# Patient Record
Sex: Female | Born: 1950 | Race: White | Hispanic: No | Smoking: Never smoker
Health system: Southern US, Community
[De-identification: ages and names within clinical notes are randomized; demographics above are authoritative.]

## PROBLEM LIST (undated history)

## (undated) DIAGNOSIS — Z8669 Personal history of other diseases of the nervous system and sense organs: Secondary | ICD-10-CM

## (undated) DIAGNOSIS — G35 Multiple sclerosis: Secondary | ICD-10-CM

## (undated) DIAGNOSIS — H539 Unspecified visual disturbance: Secondary | ICD-10-CM

## (undated) HISTORY — PX: OTHER SURGICAL HISTORY: SHX169

## (undated) HISTORY — PX: BREAST LUMPECTOMY: SHX2

## (undated) HISTORY — DX: Unspecified visual disturbance: H53.9

## (undated) HISTORY — DX: Personal history of other diseases of the nervous system and sense organs: Z86.69

## (undated) HISTORY — DX: Multiple sclerosis: G35

## (undated) HISTORY — PX: ABDOMINAL HYSTERECTOMY: SHX81

---

## 2014-12-04 ENCOUNTER — Other Ambulatory Visit: Payer: Self-pay | Admitting: Certified Nurse Midwife

## 2014-12-04 DIAGNOSIS — R531 Weakness: Secondary | ICD-10-CM

## 2014-12-04 DIAGNOSIS — G549 Nerve root and plexus disorder, unspecified: Secondary | ICD-10-CM

## 2014-12-16 ENCOUNTER — Ambulatory Visit
Admission: RE | Admit: 2014-12-16 | Discharge: 2014-12-16 | Disposition: A | Discharge status: Home / Self Care | Payer: 59 | Source: Ambulatory Visit | Attending: Certified Nurse Midwife | Admitting: Certified Nurse Midwife

## 2014-12-16 DIAGNOSIS — G549 Nerve root and plexus disorder, unspecified: Secondary | ICD-10-CM

## 2014-12-16 DIAGNOSIS — R531 Weakness: Secondary | ICD-10-CM

## 2014-12-16 MED ORDER — GADOBENATE DIMEGLUMINE 529 MG/ML IV SOLN
15.0000 mL | Freq: Once | INTRAVENOUS | Status: AC | PRN
  Administered 2014-12-16: 15 mL via INTRAVENOUS

## 2014-12-31 ENCOUNTER — Telehealth: Payer: Self-pay | Admitting: *Deleted

## 2014-12-31 ENCOUNTER — Encounter: Payer: Self-pay | Admitting: Neurology

## 2014-12-31 ENCOUNTER — Ambulatory Visit (INDEPENDENT_AMBULATORY_CARE_PROVIDER_SITE_OTHER): Payer: 59 | Admitting: Neurology

## 2014-12-31 VITALS — BP 139/71 | HR 80 | Ht 62.0 in | Wt 176.2 lb

## 2014-12-31 DIAGNOSIS — G379 Demyelinating disease of central nervous system, unspecified: Secondary | ICD-10-CM

## 2014-12-31 DIAGNOSIS — G35 Multiple sclerosis: Secondary | ICD-10-CM

## 2014-12-31 NOTE — Progress Notes (Addendum)
[facility]    Provider:  Dr Lucia Gaskins Referring Provider: Nira Conn, MD Primary Care Physician:  No primary care provider on file.  CC:  Left-sided symptoms  HPI:  Isabel Cross is a 64 y.o. female here as a referral from Dr. Lahoma Rocker for left-sided symptoms . Started with numbness on the bottom of her feet years ago. The symptoms progressed, like walking on wadded up socks with numbness. She started having back pain and a chiropractor helped. Symptoms have been slowly accumulating since then. She started dragging her left toe 3-4 months ago. Having left arm pain and weakness. She will drop dishes out of the left hand. Her left leg with give-way and she will fall.  She has neck pain that radiates into the shoulder. Her left arm is weak. No numbness or tingling in the left arm. Her grip is poor in the left arm. The right arm and leg are perfect. She has some numbness in the left leg and he can't feel it as much. No sensory changes in the face, no double vision or viision changes, no dysphagia or dysarthria, no change in bowel or bladder. No FHx of multiple sclerosis or autoimmune disease.   Reviewed notes, labs and imaging from outside physicians, which showed:  MRI of the cervical and thoracic spine w/wo contrast(personally reviewed and agree with following):   C-spine:  Abnormal left sided T2 cord signal extending from C3-C5. There may be more subtle abnormal cord signal on the right. This could be seen with multiple sclerosis, myelomalacia from old insult, and M-mode, vasculitis, neurosarcoidosis or ADEM among other causes. Post-gad imaging of the cervical spine is recommended for further assessment.   Lumbar spine: 1. Multiple areas of abnormal signal in the thoracic spinal cord consistent with demyelination similar to the appearance in the cervical spinal cord. The findings are consistent with demyelinating disease. 2. 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 3. Cholelithiasis.  Review of Systems: Patient complains of symptoms per HPI as well as the following symptoms: Fatigue, numbness, weakness, aching muscles, decreased energy. Pertinent negatives per HPI. All others negative.   Social History   Social History  . Marital Status: Widowed    Spouse Name: N/A  . Number of Children: 1  . Years of Education: 11   Occupational History  . Retired    Social History Main Topics  . Smoking status: Never Smoker   . Smokeless tobacco: Not on file  . Alcohol Use: No  . Drug Use: No  . Sexual Activity: Not on file   Other Topics Concern  . Not on file   Social History Narrative   Lives at home alone   Caffeine use: 2 cups coffee per day   Tea/soda occasionally     Family History  Problem Relation Age of Onset  . CAD Mother   . Renal cancer    . Colon cancer    . Breast cancer Sister   . Multiple sclerosis Neg Hx     Past Medical History  Diagnosis Date  . History of nerve impingement     Past Surgical History  Procedure Laterality Date  . Abdominal hysterectomy      For fibroids. Still has ovaries.  . Breast lumpectomy Right     x2, both benign  . Abscess Right     Brody's abscess removed- ankle    No current outpatient prescriptions on file.   No current facility-administered medications for this visit.  Allergies as of 12/31/2014 - Review Complete 12/31/2014  Allergen Reaction Noted  . Other  12/31/2014    Vitals: BP 139/71 mmHg  Pulse 80  Ht  (1.575 m)  Wt 176 lb 3.2 oz (79.924 kg)  BMI 32.22 kg/m2 Last Weight:  Wt Readings from Last 1 Encounters:  12/31/14 176 lb 3.2 oz (79.924 kg)   Last Height:   Ht Readings from Last 1 Encounters:  12/31/14  (1.575 m)   Physical exam: Exam: Gen: NAD, conversant, well nourised, obese, well groomed                     CV: RRR, no MRG. No Carotid Bruits. No peripheral edema, warm, nontender Eyes: Conjunctivae clear without  exudates or hemorrhage  Neuro: Detailed Neurologic Exam  Speech:    Speech is normal; fluent and spontaneous with normal comprehension.  Cognition:    The patient is oriented to person, place, and time;     recent and remote memory intact;     language fluent;     normal attention, concentration,     fund of knowledge Cranial Nerves:    The pupils are equal, round, and reactive to light. The fundi areflat. Visual fields are full to finger confrontation. Extraocular movements are intact. Trigeminal sensation is intact and the muscles of mastication are normal. The face is symmetric. The palate elevates in the midline. Hearing intact. Voice is normal. Shoulder shrug is normal. The tongue has normal motion without fasciculations.   Coordination:    No dysmetria  Gait:    Mild steppage gait, also legs of unequal length  Motor Observation:    No asymmetry, no atrophy, and no involuntary movements noted. Tone: Increased tone in the left > right leg   Posture:    Posture is normal.    Strength: Left deltoid 4+5 Left HF 3+/5 Left leg flexion 4+/5 Left DF 1/5 Left PF 3/5  Otherwise strength is V/V in the upper and lower limbs.      Sensation: intact to LT     Reflex Exam:  DTR's: absent left AJ otherwise brisk   Toes:    The toes are equivocal bilaterally.   Clonus:    3 beats on the right ankle   Assessment/Plan:  Very lovely 64 year old female with a significant burden of demyelinating plaques in the cervical and thoracic spine likely multiple sclerosis. An MRI of the brain w/wo contrast ordered and an MRI of the cervical spine w/wo contrast previous ordered by nsy is pending and labs. Pending MRI of the brain will discuss possible lumbar puncture however given imaging the likelihood of MS is high. Will refer to our MS internal clinic.   CC: Nira Conn, MD  Addendum: stratify serum JCV AB w/rfx negative 01/08/2015  Naomie Dean, MD  [facility] [address] [place], [place] [address]  Phone [phone] Fax [phone]

## 2014-12-31 NOTE — Patient Instructions (Signed)
Remember to drink plenty of fluid, eat healthy meals and do not skip any meals. Try to eat protein with a every meal and eat a healthy snack such as fruit or nuts in between meals. Try to keep a regular sleep-wake schedule and try to exercise daily, particularly in the form of walking, 20-30 minutes a day, if you can.   As far as diagnostic testing: MRI of the brain, labs  I would like to see you back after imaging complete, sooner if we need to. Please call us with any interim questions, concerns, problems, updates or refill requests.   Referral to Dr. Despina Arias, MS specialist  Our phone number is [phone]. We also have an after hours call service for urgent matters and there is a physician on-call for urgent questions. For any emergencies you know to call 911 or go to the nearest emergency room

## 2014-12-31 NOTE — Telephone Encounter (Signed)
Called Shiane at [facility] for lab pick-up lock box/routine. Confirmation number: [id number].

## 2015-01-01 ENCOUNTER — Encounter: Payer: Self-pay | Admitting: Neurology

## 2015-01-01 DIAGNOSIS — G35 Multiple sclerosis: Secondary | ICD-10-CM | POA: Insufficient documentation

## 2015-01-02 LAB — MULTIPLE MYELOMA PANEL, SERUM
ALBUMIN/GLOB SERPL: 1.2 (ref 0.7–1.7)
ALPHA2 GLOB SERPL ELPH-MCNC: 0.8 g/dL (ref 0.4–1.0)
Albumin SerPl Elph-Mcnc: 3.7 g/dL (ref 2.9–4.4)
Alpha 1: 0.2 g/dL (ref 0.0–0.4)
B-GLOBULIN SERPL ELPH-MCNC: 1.1 g/dL (ref 0.7–1.3)
GLOBULIN, TOTAL: 3.3 g/dL (ref 2.2–3.9)
Gamma Glob SerPl Elph-Mcnc: 1.1 g/dL (ref 0.4–1.8)
IgA/Immunoglobulin A, Serum: 290 mg/dL (ref 87–352)
IgG (Immunoglobin G), Serum: 1157 mg/dL (ref 700–1600)
IgM (Immunoglobulin M), Srm: 94 mg/dL (ref 26–217)

## 2015-01-02 LAB — CBC
HEMOGLOBIN: 12.7 g/dL (ref 11.1–15.9)
Hematocrit: 38.1 % (ref 34.0–46.6)
MCH: 28.6 pg (ref 26.6–33.0)
MCHC: 33.3 g/dL (ref 31.5–35.7)
MCV: 86 fL (ref 79–97)
Platelets: 268 10*3/uL (ref 150–379)
RBC: 4.44 x10E6/uL (ref 3.77–5.28)
RDW: 13.2 % (ref 12.3–15.4)
WBC: 7.4 10*3/uL (ref 3.4–10.8)

## 2015-01-02 LAB — NEUROMYELITIS OPTICA AUTOAB, IGG: NMO-IgG: <1.5 U/mL (ref 0.0–3.0)

## 2015-01-02 LAB — COMPREHENSIVE METABOLIC PANEL
ALK PHOS: 104 IU/L (ref 39–117)
ALT: 16 IU/L (ref 0–32)
AST: 15 IU/L (ref 0–40)
Albumin/Globulin Ratio: 1.5 (ref 1.1–2.5)
Albumin: 4.2 g/dL (ref 3.6–4.8)
BUN/Creatinine Ratio: 21 (ref 11–26)
BUN: 12 mg/dL (ref 8–27)
Bilirubin Total: <0.2 mg/dL (ref 0.0–1.2)
CALCIUM: 9.7 mg/dL (ref 8.7–10.3)
CO2: 21 mmol/L (ref 18–29)
CREATININE: 0.56 mg/dL — AB (ref 0.57–1.00)
Chloride: 104 mmol/L (ref 97–106)
GFR calc Af Amer: 115 mL/min/{1.73_m2} (ref >59)
GFR, EST NON AFRICAN AMERICAN: 100 mL/min/{1.73_m2} (ref >59)
GLUCOSE: 95 mg/dL (ref 65–99)
Globulin, Total: 2.8 g/dL (ref 1.5–4.5)
Potassium: 4 mmol/L (ref 3.5–5.2)
Sodium: 140 mmol/L (ref 136–144)
Total Protein: 7 g/dL (ref 6.0–8.5)

## 2015-01-02 LAB — ANA COMPREHENSIVE PANEL
Anti JO-1: <0.2 AI (ref 0.0–0.9)
Centromere Ab Screen: <0.2 AI (ref 0.0–0.9)
ENA RNP Ab: 0.2 AI (ref 0.0–0.9)
ENA SSB (LA) Ab: <0.2 AI (ref 0.0–0.9)
dsDNA Ab: 1 IU/mL (ref 0–9)

## 2015-01-02 LAB — RPR: RPR Ser Ql: NONREACTIVE

## 2015-01-02 LAB — B12 AND FOLATE PANEL
FOLATE: 6.1 ng/mL (ref >3.0)
VITAMIN B 12: 435 pg/mL (ref 211–946)

## 2015-01-02 LAB — HIV ANTIBODY (ROUTINE TESTING W REFLEX): HIV Screen 4th Generation wRfx: NONREACTIVE

## 2015-01-02 LAB — TSH: TSH: 1.58 u[IU]/mL (ref 0.450–4.500)

## 2015-01-02 LAB — RHEUMATOID FACTOR

## 2015-01-02 LAB — ANA: ANA Titer 1: NEGATIVE

## 2015-01-02 LAB — ANGIOTENSIN CONVERTING ENZYME: Angio Convert Enzyme: 41 U/L (ref 14–82)

## 2015-01-04 ENCOUNTER — Telehealth: Payer: Self-pay | Admitting: Neurology

## 2015-01-04 NOTE — Telephone Encounter (Signed)
Spoke to patient. Gave lab results and instructions per Dr. Trevor Mace lab result note. Patient verbalized understanding. She states her MRI of the Brain is scheduled for 01/17/15.

## 2015-01-04 NOTE — Telephone Encounter (Signed)
Patient returned call, regarding lab results, received message yesterday 01/03/15.

## 2015-01-13 ENCOUNTER — Other Ambulatory Visit: Payer: 59

## 2015-01-17 ENCOUNTER — Ambulatory Visit
Admission: RE | Admit: 2015-01-17 | Discharge: 2015-01-17 | Disposition: A | Discharge status: Home / Self Care | Payer: 59 | Source: Ambulatory Visit | Attending: Neurology | Admitting: Neurology

## 2015-01-17 DIAGNOSIS — G379 Demyelinating disease of central nervous system, unspecified: Secondary | ICD-10-CM

## 2015-01-21 ENCOUNTER — Ambulatory Visit (INDEPENDENT_AMBULATORY_CARE_PROVIDER_SITE_OTHER): Payer: 59 | Admitting: Neurology

## 2015-01-21 ENCOUNTER — Encounter: Payer: Self-pay | Admitting: Neurology

## 2015-01-21 VITALS — BP 136/76 | HR 60 | Resp 14 | Ht 62.0 in | Wt 174.2 lb

## 2015-01-21 DIAGNOSIS — E559 Vitamin D deficiency, unspecified: Secondary | ICD-10-CM

## 2015-01-21 DIAGNOSIS — G35 Multiple sclerosis: Secondary | ICD-10-CM

## 2015-01-21 DIAGNOSIS — M21372 Foot drop, left foot: Secondary | ICD-10-CM | POA: Diagnosis not present

## 2015-01-21 DIAGNOSIS — R001 Bradycardia, unspecified: Secondary | ICD-10-CM | POA: Insufficient documentation

## 2015-01-21 DIAGNOSIS — Z79899 Other long term (current) drug therapy: Secondary | ICD-10-CM | POA: Insufficient documentation

## 2015-01-21 DIAGNOSIS — M7552 Bursitis of left shoulder: Secondary | ICD-10-CM

## 2015-01-21 MED ORDER — BACLOFEN 10 MG PO TABS
10.0000 mg | ORAL_TABLET | Freq: Three times a day (TID) | ORAL | Status: AC
Start: 2015-01-21 — End: ?

## 2015-01-21 NOTE — Progress Notes (Addendum)
[facility]  PATIENT: Isabel Cross DOB: 1950/04/02  REFERRING DOCTOR OR PCP:  Dr. Lucia Gaskins.   PCP is Dr. Lahoma Rocker SOURCE: patient, MRI images on PACS, records/Labs in EMR  _________________________________   HISTORICAL  CHIEF COMPLAINT:  Chief Complaint  Patient presents with  . Extremity Weakness    Isabel Cross is here with her sister Dennie Bible for eval of left sided weakness, gait disturbance, neck and back pain.  Sts. has had neck and back pain off and on for yrs.  Left sided weakness started around May 2016.  MRI's of her brain, c-spine and t-spine have all been abnormal.  She is here to discuss those results and possible dx. of MS and tx. options/fim    HISTORY OF PRESENT ILLNESS:  Isabel Cross is a 64 year old woman referred for second opinion for the possibility of multiple sclerosis.  In February or March, she had noted back pain.   She had a foot drop that developed a left foot drop and milder left arm weakness.   This was associated with pain in the left shoulder.  She had a lumbar MRI at first and then in September, she had a cervical spine MRI showing a large C2C3 to C4C5/    She feels she has worsened some compared to earlier this year.  She continues to  experience a lot of pain in the left shoulder and left foot.    She feels like her neck muscles are being pulled off her spine if she turns or lays on right side.   She is more comfortable on her right side with neck turned tilted right.  In retrospect, 5 years ago, she had an episode of numbness in both feet  Gait is poor due to a left foot drop.   She has fallen multiple times.   She is getting a left lower leg orthotic.   She walked better with one in the shop and picks up her own brace later this week.  Bladder function is fine and she has no nocturia  Vision is fine.  She wears glasses but has no recent changes.    She notes a lot of fatigue.   She tires out easily while walking.   She did not note any  change in fatigue related to temperature or time of day.    She has trouble staying asleep    Mood is doing well and she denies depression or anxiety.   She has not noted any difficulty with cognition.    I personally reviewed the MRI of the cervical spine from 11/23/2014, thoracic spine form 10/.2016and the brain from 01/17/2015.  I reviewed these in her presence.   The MRI of the cervical spine shows a large focus on the left from C2-C3 to C4-C5. Additionally, there is a small focus on the right adjacent to C2. Neither of these is associated with cord swelling.   Additional note is made of some changes within the vertebral bodies that could represent atypical hemangiomas and less likely neoplasm.   2-3  foci are located in the thoracic spinal cord. The MRI of the brain shows several periventricular and juxtacortical T2/FLAIR hyperintense foci in a pattern that would be consistent with multiple sclerosis.    REVIEW OF SYSTEMS: Constitutional: No fevers, chills, sweats, or change in appetite Eyes: No visual changes, double vision, eye pain Ear, nose and throat: No hearing loss, ear pain, nasal congestion, sore throat Cardiovascular: No chest pain, palpitations Respiratory: No shortness of breath  at rest or with exertion.   No wheezes GastrointestinaI: No nausea, vomiting, diarrhea, abdominal pain, fecal incontinence Genitourinary: No dysuria, urinary retention or frequency.  No nocturia. Musculoskeletal: No neck pain, back pain Integumentary: No rash, pruritus, skin lesions Neurological: as above Psychiatric: No depression at this time.  No anxiety Endocrine: No palpitations, diaphoresis, change in appetite, change in weigh or increased thirst Hematologic/Lymphatic: No anemia, purpura, petechiae. Allergic/Immunologic: No itchy/runny eyes, nasal congestion, recent allergic reactions, rashes  ALLERGIES: Allergies  Allergen Reactions  . Other     Anti-inflammatory medications causes stomach  to be upset    HOME MEDICATIONS: No current outpatient prescriptions on file.  PAST MEDICAL HISTORY: Past Medical History  Diagnosis Date  . History of nerve impingement   . Vision abnormalities   . Multiple sclerosis (HCC)     PAST SURGICAL HISTORY: Past Surgical History  Procedure Laterality Date  . Abdominal hysterectomy      For fibroids. Still has ovaries.  . Breast lumpectomy Right     x2, both benign  . Abscess Right     Brody's abscess removed- ankle    FAMILY HISTORY: Family History  Problem Relation Age of Onset  . CAD Mother   . Renal cancer    . Colon cancer    . Breast cancer Sister   . Multiple sclerosis Neg Hx     SOCIAL HISTORY:  Social History   Social History  . Marital Status: Widowed    Spouse Name: N/A  . Number of Children: 1  . Years of Education: 11   Occupational History  . Retired    Social History Main Topics  . Smoking status: Never Smoker   . Smokeless tobacco: Not on file  . Alcohol Use: No  . Drug Use: No  . Sexual Activity: Not on file   Other Topics Concern  . Not on file   Social History Narrative   Lives at home alone   Caffeine use: 2 cups coffee per day   Tea/soda occasionally      PHYSICAL EXAM  Filed Vitals:   01/21/15 0908  BP: 136/76  Pulse: 60  Resp: 14  Height: 5\' 2"  (1.575 m)  Weight: 174 lb 3.2 oz (79.017 kg)    Body mass index is 31.85 kg/(m^2).   General: The patient is well-developed and well-nourished and in no acute distress  Eyes:  Funduscopic exam shows normal optic discs and retinal vessels.  Neck: The neck is supple, no carotid bruits are noted.  The neck is nontender.  Cardiovascular: The heart has a regular rate and rhythm with a normal S1 and S2. There were no murmurs, gallops or rubs.    Skin: Extremities are without significant edema.  Musculoskeletal:  Back is nontender.  She has mild tenderness over the lower cervical paraspinal muscles. She has moderate tenderness at  the left subacromial bursa and left AC joint.  Neurologic Exam  Mental status: The patient is alert and oriented x 3 at the time of the examination. The patient has apparent normal recent and remote memory, with an apparently normal attention span and concentration ability.   Speech is normal.  Cranial nerves: Extraocular movements are full. Pupils are equal, round, and reactive to light and accomodation.  Color vision is symmetric. Visual fields are full.  Facial symmetry is present. There is good facial sensation to soft touch bilaterally.Facial strength is normal.  Trapezius and sternocleidomastoid strength is normal. No dysarthria is noted.  The tongue is  midline, and the patient has symmetric elevation of the soft palate. No obvious hearing deficits are noted.  Motor:  Muscle bulk is normal.   Tone is increased in the left leg more than the left arm. Tone is normal in the right. Strength is 2/5 in the left toe and ankle extensors 4 - / 5 in ankle plantar flexor.   And 4+/5 proximally in the left leg .Marland Kitchen Strength is  5 / 5 in other xtremities.   Sensory: Sensory testing is intact to pinprick, soft touch and vibration sensation in the arms but she has reduced vibration in the left foot..  Coordination: Cerebellar testing reveals good finger-nose-finger and reduced left heel-to-shin bilaterally.   Left rapid alternating movements are slightly reduced in arm  Gait and station: Station is normal.   Gait is spastic and she has a left foot drop. She cannot tandem walk without holding on.. Romberg is negative.   Reflexes: Deep tendon reflexes are increased in the left leg and she has 2 beats of nonsustained clonus at the ankle and spread at the knee.   Plantar responses are flexor.    DIAGNOSTIC DATA (LABS, IMAGING, TESTING) - I reviewed patient records, labs, notes, testing and imaging myself where available.  Lab Results  Component Value Date   WBC 7.4 12/31/2014   HCT 38.1 12/31/2014        Component Value Date/Time   NA 140 12/31/2014 1540   K 4.0 12/31/2014 1540   CL 104 12/31/2014 1540   CO2 21 12/31/2014 1540   GLUCOSE 95 12/31/2014 1540   BUN 12 12/31/2014 1540   CREATININE 0.56* 12/31/2014 1540   CALCIUM 9.7 12/31/2014 1540   PROT 7.0 12/31/2014 1540   ALBUMIN 4.2 12/31/2014 1540   AST 15 12/31/2014 1540   ALT 16 12/31/2014 1540   ALKPHOS 104 12/31/2014 1540   BILITOT <0.2 12/31/2014 1540   GFRNONAA 100 12/31/2014 1540   GFRAA 115 12/31/2014 1540   No results found for: CHOL, HDL, LDLCALC, LDLDIRECT, TRIG, CHOLHDL No results found for: ZOXW9U Lab Results  Component Value Date   VITAMINB12 435 12/31/2014   Lab Results  Component Value Date   TSH 1.580 12/31/2014       ASSESSMENT AND PLAN  High risk medication use - Plan: CBC with Differential/Platelet, Hepatic function panel, Varicella zoster antibody, IgG  Left foot drop  Bursitis of left shoulder - Plan: CBC with Differential/Platelet, Hepatic function panel, Varicella zoster antibody, IgG  Multiple sclerosis (HCC) - Plan: CBC with Differential/Platelet, Hepatic function panel, Varicella zoster antibody, IgG, Vit D  25 hydroxy (rtn osteoporosis monitoring)  Vitamin D deficiency - Plan: Vit D  25 hydroxy (rtn osteoporosis monitoring)  Bradycardia    1.   I reviewed her MRI images in front of her and her sister. Her history and studies are very consistent with multiple sclerosis. Due to the abrupt onset of left-sided symptoms earlier this year, her course appears to be relapsing remitting.     2.  We had a long discussion about MS and treatment options. She would prefer an oral agent over an injection and we went over the 3 options. Due to several spinal plaques, I would like to place her on a higher efficacy agent and she will sign up for Gilenya. She will get her eyes examined. We will do the blood work and EKG today. First a observation will be set up as soon as possible. 3.  I will add  baclofen to  see if that can help her pain and muscle spasticity. If this makes her sleepy she will just taken bedtime. 4.  A left subacromial bursa injection was offered but she would prefer to hold off at this time.  She will return to see me in 3 months or sooner if her new or worsening neurologic symptoms.  50 minute face-to-face evaluation with greater than one half of the time counseling and coordinating care about her new diagnosis of multiple sclerosis, prognosis, symptoms appeared options.  Addendum:  EKG shows minimal bradycardia (sinus 58) and is otherwise normal.   Richard A. Epimenio Foot, MD, PhD 01/21/2015, 9:29 AM Certified in Neurology, Clinical Neurophysiology, Sleep Medicine, Pain Medicine and Neuroimaging  [facility] [address] [place], [place] [address] [phone]  Addendum (01/23/2015) dictation errors in 'plan' corrected

## 2015-01-22 LAB — VARICELLA ZOSTER ANTIBODY, IGG

## 2015-01-22 LAB — HEPATIC FUNCTION PANEL
ALK PHOS: 105 IU/L (ref 39–117)
ALT: 20 IU/L (ref 0–32)
AST: 21 IU/L (ref 0–40)
Albumin: 4.5 g/dL (ref 3.6–4.8)
BILIRUBIN, DIRECT: 0.12 mg/dL (ref 0.00–0.40)
Bilirubin Total: 0.4 mg/dL (ref 0.0–1.2)
Total Protein: 7.6 g/dL (ref 6.0–8.5)

## 2015-01-22 LAB — VITAMIN D 25 HYDROXY (VIT D DEFICIENCY, FRACTURES): Vit D, 25-Hydroxy: 12.1 ng/mL — ABNORMAL LOW (ref 30.0–100.0)

## 2015-01-22 LAB — CBC WITH DIFFERENTIAL/PLATELET
BASOS: 1 %
Basophils Absolute: 0 10*3/uL (ref 0.0–0.2)
EOS (ABSOLUTE): 0.4 10*3/uL (ref 0.0–0.4)
EOS: 7 %
HEMATOCRIT: 39.7 % (ref 34.0–46.6)
HEMOGLOBIN: 13.2 g/dL (ref 11.1–15.9)
IMMATURE GRANULOCYTES: 0 %
Immature Grans (Abs): 0 10*3/uL (ref 0.0–0.1)
Lymphocytes Absolute: 2.3 10*3/uL (ref 0.7–3.1)
Lymphs: 39 %
MCH: 28.6 pg (ref 26.6–33.0)
MCHC: 33.2 g/dL (ref 31.5–35.7)
MCV: 86 fL (ref 79–97)
MONOCYTES: 6 %
Monocytes Absolute: 0.4 10*3/uL (ref 0.1–0.9)
NEUTROS PCT: 47 %
Neutrophils Absolute: 2.8 10*3/uL (ref 1.4–7.0)
Platelets: 284 10*3/uL (ref 150–379)
RBC: 4.61 x10E6/uL (ref 3.77–5.28)
RDW: 13.3 % (ref 12.3–15.4)
WBC: 6 10*3/uL (ref 3.4–10.8)

## 2015-01-23 ENCOUNTER — Telehealth: Payer: Self-pay | Admitting: Neurology

## 2015-01-23 DIAGNOSIS — Z79899 Other long term (current) drug therapy: Secondary | ICD-10-CM

## 2015-01-23 DIAGNOSIS — G35 Multiple sclerosis: Secondary | ICD-10-CM

## 2015-01-23 MED ORDER — VITAMIN D (ERGOCALCIFEROL) 1.25 MG (50000 UNIT) PO CAPS
50000.0000 [IU] | ORAL_CAPSULE | ORAL | Status: AC
Start: 2015-01-23 — End: ?

## 2015-01-23 NOTE — Telephone Encounter (Signed)
Patient returned call

## 2015-01-23 NOTE — Telephone Encounter (Signed)
-----   Message from Richard A Sater, MD sent at 01/23/2015  8:40 AM EST ----- With return in the Gilenya form.   Those labs are fine. Please note that the vitamin D is low. Please send in vitamin D script 50,000 U weekly 12 weeks. Then 4000-5000 units daily OTC.  

## 2015-01-23 NOTE — Telephone Encounter (Signed)
-----   Message from Asa Lente, MD sent at 01/23/2015  8:40 AM EST ----- With return in the Gilenya form.   Those labs are fine. Please note that the vitamin D is low. Please send in vitamin D script 50,000 U weekly 12 weeks. Then 4000-5000 units daily OTC.

## 2015-01-27 ENCOUNTER — Telehealth: Payer: Self-pay

## 2015-01-27 NOTE — Telephone Encounter (Signed)
[facility]) has approved the request for coverage on Gilenya effective until 01/25/2016 Ref # [id number]

## 2015-01-28 ENCOUNTER — Encounter: Payer: Self-pay | Admitting: *Deleted

## 2015-01-28 ENCOUNTER — Other Ambulatory Visit: Payer: Self-pay

## 2015-01-28 MED ORDER — FINGOLIMOD HCL 0.5 MG PO CAPS: 0.5000 mg | ORAL_CAPSULE | Freq: Every day | ORAL | Status: AC

## 2015-01-30 ENCOUNTER — Telehealth: Payer: Self-pay | Admitting: *Deleted

## 2015-01-30 NOTE — Telephone Encounter (Signed)
LMTC.  Need to see where we are with getting her Gilenya for her fdo/fim

## 2015-02-14 ENCOUNTER — Telehealth: Payer: Self-pay | Admitting: Neurology

## 2015-02-14 NOTE — Telephone Encounter (Signed)
Pt inquired if she should eat prior to appt on Monday.

## 2015-02-14 NOTE — Telephone Encounter (Signed)
I have spoken with Isabel Cross this afternoon and advised she should eat breakfast prior to appt. for Gilenya FDO on Monday.  She may also bring snacks with her--I can put anything she needs in the fridge.  She verbalized understanding of same/fim

## 2015-02-18 ENCOUNTER — Ambulatory Visit (INDEPENDENT_AMBULATORY_CARE_PROVIDER_SITE_OTHER): Payer: 59 | Admitting: Neurology

## 2015-02-18 ENCOUNTER — Encounter (INDEPENDENT_AMBULATORY_CARE_PROVIDER_SITE_OTHER): Payer: Self-pay

## 2015-02-18 ENCOUNTER — Encounter: Payer: Self-pay | Admitting: Neurology

## 2015-02-18 VITALS — BP 126/72 | HR 74 | Resp 16 | Ht 62.0 in | Wt 174.0 lb

## 2015-02-18 DIAGNOSIS — Z79899 Other long term (current) drug therapy: Secondary | ICD-10-CM | POA: Diagnosis not present

## 2015-02-18 DIAGNOSIS — M21372 Foot drop, left foot: Secondary | ICD-10-CM | POA: Diagnosis not present

## 2015-02-18 DIAGNOSIS — G35 Multiple sclerosis: Secondary | ICD-10-CM | POA: Diagnosis not present

## 2015-02-18 DIAGNOSIS — R001 Bradycardia, unspecified: Secondary | ICD-10-CM

## 2015-02-18 NOTE — Progress Notes (Signed)
[facility]  PATIENT: Isabel Cross DOB: [birth year]  REFERRING DOCTOR OR PCP:  Dr. Lucia Gaskins.   PCP is Dr. Lahoma Rocker SOURCE: patient, MRI images on PACS, records/Labs in EMR  _________________________________   HISTORICAL  CHIEF COMPLAINT:  Chief Complaint  Patient presents with  . Multiple Sclerosis    Gilenya FDO--0820--EKG done, shown to Dr. Epimenio Foot, and v/o given to proceed with fdo.  0840--126/72-74-16.  Gilenya 0.5mg  po given.  0915--116/70-72-14.  Pt. watching t.v., visiting with sister.  0945-120/70-72-14. 1015-136/72-68-16.  Up to bathroom. 1045-126/68-60-14. 1115-126/68-64-14.  Remains alert and oriented with skin warm, dry, pink. Watching t.v, speaking with sister. 1150-124/68-60-16. Eating lunch, watching t.v. No acute distress noted. 1240--116/64-68-14. 1310--118/64-60-14. 1350-128/72-60-16.  Velora Heckler FDO    1420-124/64-60-14.  1430--EKG done, pt. to room 5 and Dr. Epimenio Foot in to speak with her.  11 day supply of Gilenya given to take home.  She has been advised to take Gilenya once in the mornings, and to expect a call from the specialty pharmacy to schedule delivery, and to let me know if she doesn't receive a call from the pharmacy.  She has been adivsed that prolonged time off of Gilenya would result in another fdo to restart.  1445--Pt. accompanied to checkout to make 3-4 mo. f/u with RAS.  I have spoken   . High Risk Medication    with Gilenya Go and advised fdo has been completed; they can release rx. to the specialty pharmacy/fim    HISTORY OF PRESENT ILLNESS:  Isabel Cross is a 64 year old woman with multiple sclerosis.   She will do her first dose of Gilenya today.    She continues to  experience a lot of pain in the left shoulder and left foot.    She feels like her neck muscles are being pulled off her spine if she turns or lays on right side.   She is more comfortable on her right side with neck turned tilted right.  In retrospect, 5 years ago, she  had an episode of numbness in both feet  Gait is reduced due to a left foot drop (unchanged).   She fell once since last visit.     She now has a left lower leg orthotic.  She feels that the brace has helped.  Bladder function is fine and she has no nocturia  Vision is fine.  She wears glasses but has no recent changes.    She notes fatigue and she tires out easily while walking.   There is no definite heat intolerance..   She has trouble staying asleep  some nights but usually will fall asleep easily.  Mood is doing well and she denies depression or anxiety.   She has not noted any difficulty with cognition.    First day observation: Her pretreatment EKG today showed normal sinus rhythm with a normal rate and intervals. There was no ischemic change. She was given 0.5 mg Gilenya and over the next 6 hours she had multiple vital signs performed. Pulse was either normal or showed mild sinus bradycardia in the upper 50s. Timed she felt lightheaded or complaint of chest pain, shortness of breath, headache or other symptoms. A second EKG was performed 6 hours after her Gilenya dose and showed mild sinus bradycardia with a rate of 58.   MS History:   March 2016, she had noted back pain and developed a left foot drop and milder left arm weakness.      She had a lumbar MRI  at first and then in September, she had a cervical spine MRI showing a large C2C3 to C4C5 focus.   MRI of the thoracic spine and brain MRI also showed lesions consistent with MS appears she was referred to me. She has not had any further exacerbations      DATA:  MRI of the cervical spine from 11/23/2014  shows a large focus on the left from C2-C3 to C4-C5. Additionally, there is a small focus on the right adjacent to C2. Neither of these is associated with cord swelling.    MRO thoracic spine shows   2-3 foci are located in the thoracic spinal cord.  The MRI of the brain shows several periventricular and juxtacortical T2/FLAIR  hyperintense foci in a pattern that would be consistent with multiple sclerosis.    REVIEW OF SYSTEMS: Constitutional: No fevers, chills, sweats, or change in appetite Eyes: No visual changes, double vision, eye pain Ear, nose and throat: No hearing loss, ear pain, nasal congestion, sore throat Cardiovascular: No chest pain, palpitations Respiratory: No shortness of breath at rest or with exertion.   No wheezes GastrointestinaI: No nausea, vomiting, diarrhea, abdominal pain, fecal incontinence Genitourinary: No dysuria, urinary retention or frequency.  No nocturia. Musculoskeletal: No neck pain, back pain Integumentary: No rash, pruritus, skin lesions Neurological: as above Psychiatric: No depression at this time.  No anxiety Endocrine: No palpitations, diaphoresis, change in appetite, change in weigh or increased thirst Hematologic/Lymphatic: No anemia, purpura, petechiae. Allergic/Immunologic: No itchy/runny eyes, nasal congestion, recent allergic reactions, rashes  ALLERGIES: Allergies  Allergen Reactions  . Other     Anti-inflammatory medications causes stomach to be upset    HOME MEDICATIONS:  Current outpatient prescriptions:  .  baclofen (LIORESAL) 10 MG tablet, Take 1 tablet (10 mg total) by mouth 3 (three) times daily., Disp: 90 each, Rfl: 5 .  Fingolimod HCl (GILENYA) 0.5 MG CAPS, Take 1 capsule (0.5 mg total) by mouth daily., Disp: 30 capsule, Rfl: 6 .  Vitamin D, Ergocalciferol, (DRISDOL) 50000 UNITS CAPS capsule, Take 1 capsule (50,000 Units total) by mouth every 7 (seven) days., Disp: 12 capsule, Rfl: 0  PAST MEDICAL HISTORY: Past Medical History  Diagnosis Date  . History of nerve impingement   . Vision abnormalities   . Multiple sclerosis (HCC)     PAST SURGICAL HISTORY: Past Surgical History  Procedure Laterality Date  . Abdominal hysterectomy      For fibroids. Still has ovaries.  . Breast lumpectomy Right     x2, both benign  . Abscess Right      Brody's abscess removed- ankle    FAMILY HISTORY: Family History  Problem Relation Age of Onset  . CAD Mother   . Renal cancer    . Colon cancer    . Breast cancer Sister   . Multiple sclerosis Neg Hx     SOCIAL HISTORY:  Social History   Social History  . Marital Status: Widowed    Spouse Name: N/A  . Number of Children: 1  . Years of Education: 11   Occupational History  . Retired    Social History Main Topics  . Smoking status: Never Smoker   . Smokeless tobacco: Not on file  . Alcohol Use: No  . Drug Use: No  . Sexual Activity: Not on file   Other Topics Concern  . Not on file   Social History Narrative   Lives at home alone   Caffeine use: 2 cups coffee per day  Tea/soda occasionally      PHYSICAL EXAM  Filed Vitals:   02/18/15 0851  BP: 126/72  Pulse: 74  Resp: 16  Height:  (1.575 m)  Weight: 174 lb (78.926 kg)    Body mass index is 31.82 kg/(m^2).   General: The patient is well-developed and well-nourished and in no acute distress    Neurologic Exam  Mental status: The patient is alert and oriented x 3 at the time of the examination. The patient has apparent normal recent and remote memory, with an apparently normal attention span and concentration ability.   Speech is normal.  Cranial nerves: Extraocular movements are full.   There is good facial sensation to soft touch bilaterally.Facial strength is normal.  Trapezius and sternocleidomastoid strength is normal. No dysarthria is noted.  No obvious hearing deficits are noted.  Motor:  Muscle bulk is normal.   Tone is increased in the left leg more than the left arm. Tone is normal in the right. Strength is 2/5 in the left toe and ankle extensors 4 - / 5 in ankle plantar flexor.   And 4+/5 proximally in the left leg .Marland Kitchen Strength is  5 / 5 in other xtremities.   Sensory: Sensory testing is intact to pinprick, soft touch and vibration sensation in the arms but she has reduced vibration in  the left foot..  Coordination: Cerebellar testing reveals good finger-nose-finger and reduced left heel-to-shin bilaterally.   Left rapid alternating movements are slightly reduced in arm  Gait and station: Station is normal.   Gait is spastic and she has a left brace. She cannot tandem walk without holding on..  Romberg is negative.   Reflexes: Deep tendon reflexes are increased in the left leg and she has 2 beats of nonsustained clonus at the ankle and spread at the knee.        DIAGNOSTIC DATA (LABS, IMAGING, TESTING) - I reviewed patient records, labs, notes, testing and imaging myself where available.  Lab Results  Component Value Date   WBC 6.0 01/21/2015   HCT 39.7 01/21/2015      Component Value Date/Time   NA 140 12/31/2014 1540   K 4.0 12/31/2014 1540   CL 104 12/31/2014 1540   CO2 21 12/31/2014 1540   GLUCOSE 95 12/31/2014 1540   BUN 12 12/31/2014 1540   CREATININE 0.56* 12/31/2014 1540   CALCIUM 9.7 12/31/2014 1540   PROT 7.6 01/21/2015 1044   ALBUMIN 4.5 01/21/2015 1044   AST 21 01/21/2015 1044   ALT 20 01/21/2015 1044   ALKPHOS 105 01/21/2015 1044   BILITOT 0.4 01/21/2015 1044   GFRNONAA 100 12/31/2014 1540   GFRAA 115 12/31/2014 1540   No results found for: CHOL, HDL, LDLCALC, LDLDIRECT, TRIG, CHOLHDL No results found for: LKGM0N Lab Results  Component Value Date   VITAMINB12 435 12/31/2014   Lab Results  Component Value Date   TSH 1.580 12/31/2014       ASSESSMENT AND PLAN  Multiple sclerosis (HCC)  Bradycardia  High risk medication use  Left foot drop    1.    Gilenya First day t\observation today.  2.   She will get her eyes examined again in 3-4 months.  3.    RTC 3-4 months, sooner if new or worsening neurologic symptom    Shaquandra Galano A. Epimenio Foot, MD, PhD 02/18/2015, 5:31 PM Certified in Neurology, Clinical Neurophysiology, Sleep Medicine, Pain Medicine and Neuroimaging  [facility] [address], [address] [place],  [place] [address] [phone]  Addendum (01/23/2015) dictation errors in 'plan' corrected

## 2015-02-21 ENCOUNTER — Telehealth: Payer: Self-pay | Admitting: Neurology

## 2015-02-21 NOTE — Telephone Encounter (Signed)
LMOM that it may take a few business days--but she should receive a call from the specialty pharmacy that will ship her Gilenya.  I spoke with [facility]enya [facility] on Monday after her fdo, and I will call them again tomorrow to make sure rx. was released to the specialty pharmacy/fim

## 2015-02-21 NOTE — Telephone Encounter (Signed)
Pt called and wants to know if this office has heard anything from Gilenya go program. She says they were to call her but they have not. She has started her medication and wants to know what she needs to do. Please call and advise [phone]

## 2015-02-22 ENCOUNTER — Encounter: Payer: Self-pay | Admitting: *Deleted

## 2015-02-22 NOTE — Telephone Encounter (Signed)
I have spoken with Isabel Cross this morning and advised that Gilenya fdo was completed 02-18-15.  She will f/u on this.  I have spoken with [name] and advised that she should receive a call from specialty pharmacy early next week to sched. delivery.  If she has not heard anything by Tuesday she will let me know/fim

## 2015-03-26 ENCOUNTER — Telehealth: Payer: Self-pay | Admitting: Neurology

## 2015-03-26 NOTE — Telephone Encounter (Signed)
Ins has been contacted and provided with clinical info.  Request is under review Ref # [id number]  [employer] [place] has approved the request for coverage on Gilenya effective until 03/14/2038, or until the policy changes or is terminated.  Ins indicates they have notified the patient of this decision as well.

## 2015-03-26 NOTE — Telephone Encounter (Signed)
Pt called said she needs PA for Fingolimod HCl (GILENYA) 0.5 MG CAPS . She now has [employer]: member # [id number]  Bin# [id number]  Grp # [id number]  She will be on zero cost RX benefit program is what has been text to her from [facility]. She has 8 pills left

## 2015-03-27 NOTE — Telephone Encounter (Signed)
I called back and spoke with the patient.  She is aware of approval.  Says she will call the pharmacy to follow up regarding scheduling refill, and will call us back if anything further is needed.

## 2015-03-27 NOTE — Telephone Encounter (Signed)
Patient called to check status of PA.

## 2015-03-28 NOTE — Telephone Encounter (Signed)
I called back and spoke with the patient.  She is going to contact PAP at [phone].  She will call us back if anything further is needed.

## 2015-03-28 NOTE — Telephone Encounter (Addendum)
Pt called and states her pharmacy called her and said her copay is $1,000. She thought she was under a pt assistance program. Please call and advise [phone]

## 2015-05-22 ENCOUNTER — Ambulatory Visit: Payer: 59 | Admitting: Neurology

## 2016-08-12 IMAGING — MR MR THORACIC SPINE WO/W CM
4 of 8 series · 17 of 48 positions shown · IV contrast (multihance)
Comparison: MRI of the cervical and lumbar spine dated 11/23/2014

CLINICAL DATA: Left leg weakness with left foot drop. Balance
difficulty. Spinal cord lesions. Vertebral body lesions in the
cervical on upper thoracic spine.

Creatinine was obtained on site at [HOSPITAL] at [HOSPITAL].
Results: Creatinine 0.7 mg/dL.
EXAM:
MRI THORACIC SPINE WITHOUT AND WITH CONTRAST
TECHNIQUE: Multiplanar and multiecho pulse sequences of the thoracic spine were
obtained without and with intravenous contrast.
CONTRAST:  15mL MULTIHANCE GADOBENATE DIMEGLUMINE 529 MG/ML IV SOLN

[Series 4: T2 · sagittal · 3.0mm · 0.50mm/px · 3 of 13 slices shown (1 of 2)]
[im 1/13]
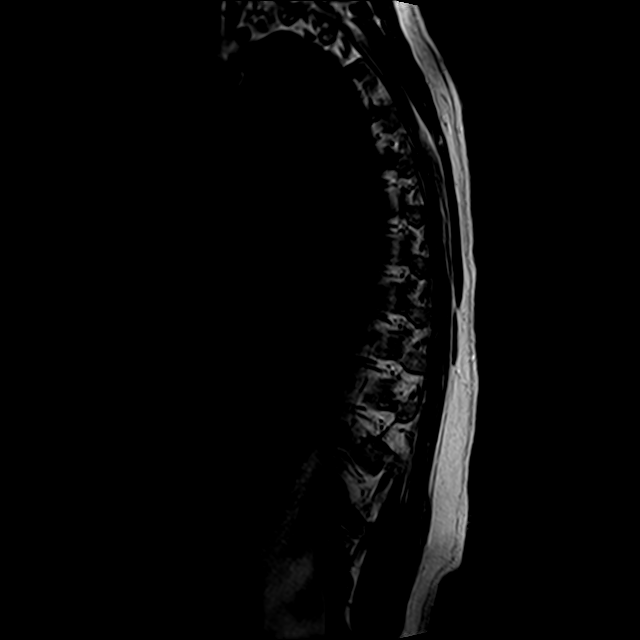
[im 7/13]
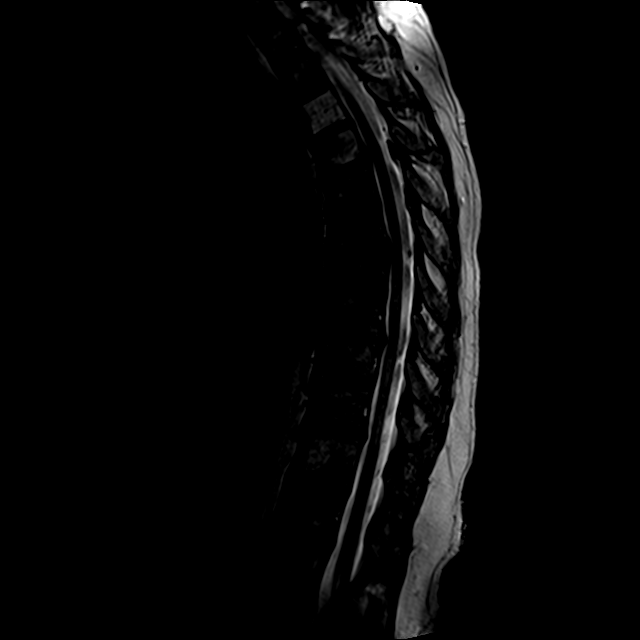
[im 13/13]
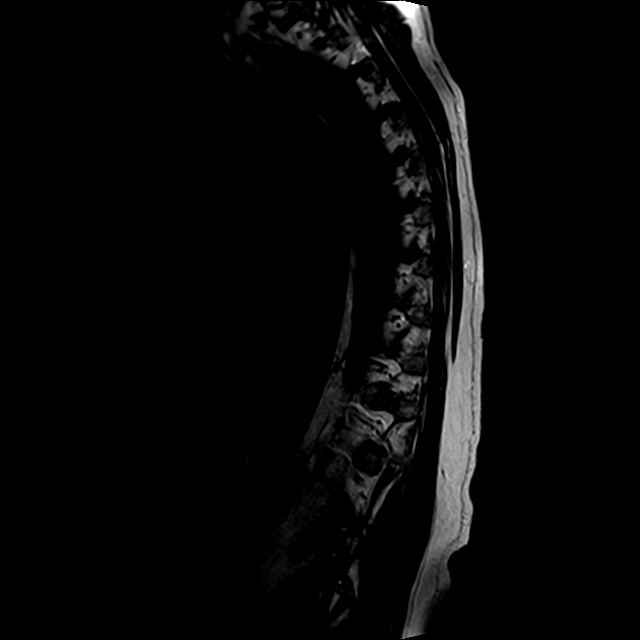

[Series 7: T1 · sagittal · 3.0mm · 0.50mm/px · 3 of 13 slices shown (1 of 2)]
[im 1/13]
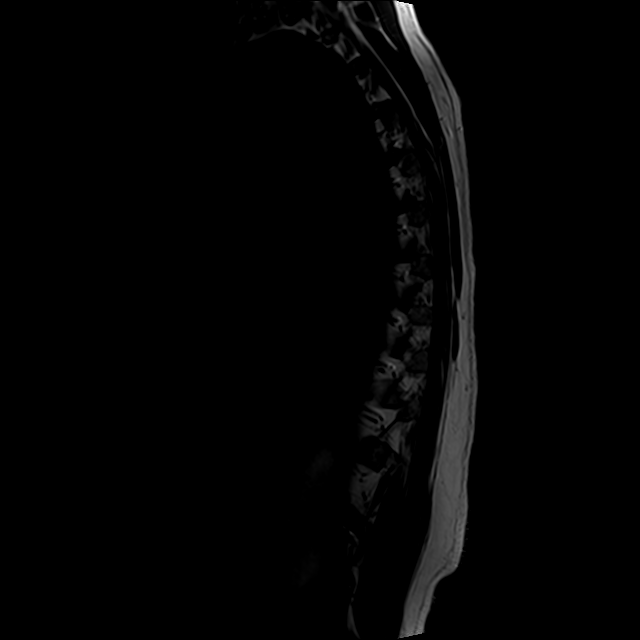
[im 7/13]
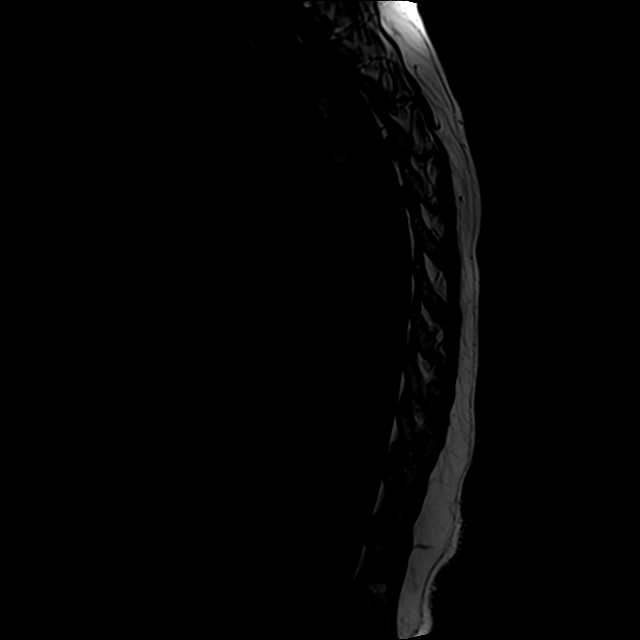
[im 13/13]
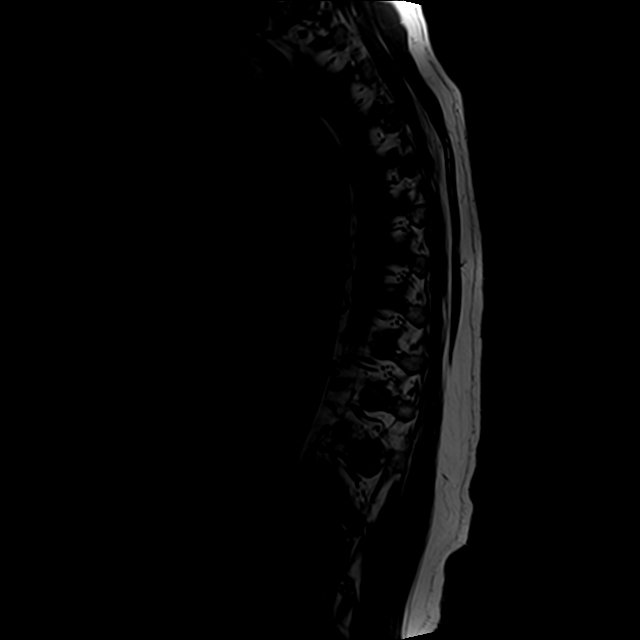

[Series 9: T2 · axial · 4.0mm · 0.39mm/px · z∈[-321,-77]mm · 8 of 48 slices shown (2 of 2)]
[im 1/48]
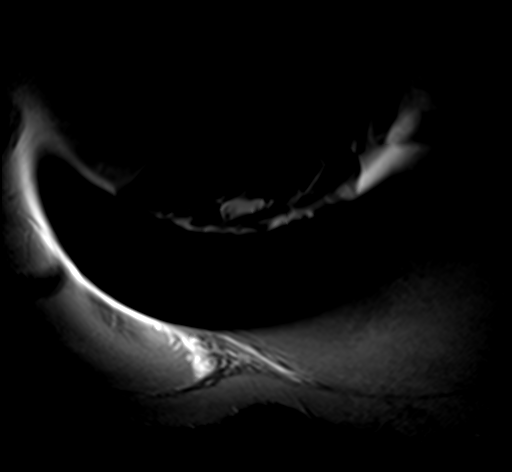
[im 6/48]
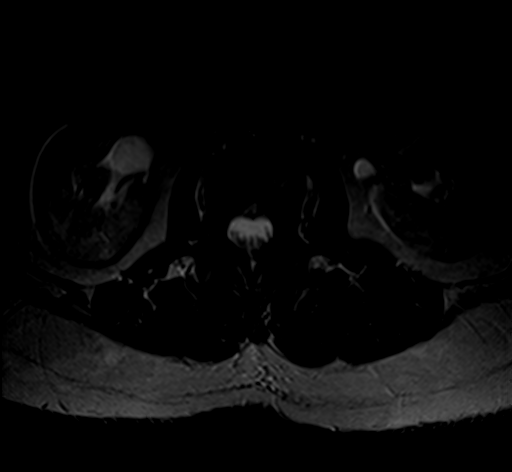
[im 12/48]
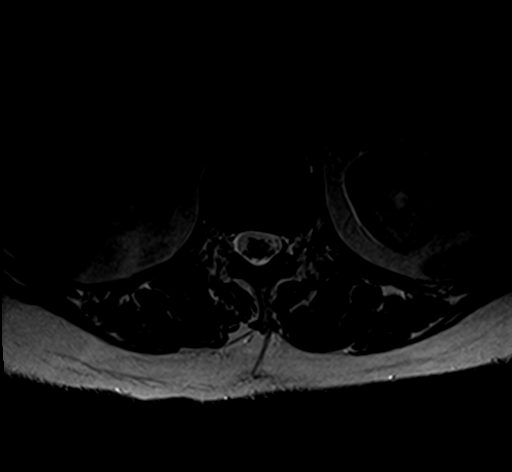
[im 18/48]
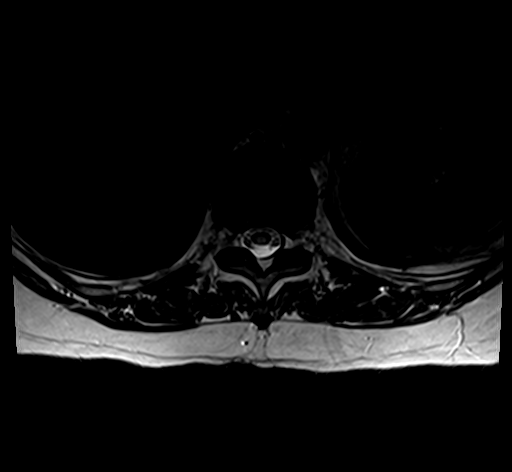
[im 24/48]
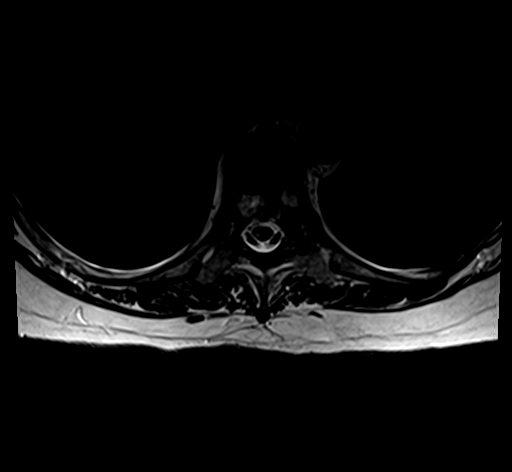
[im 30/48]
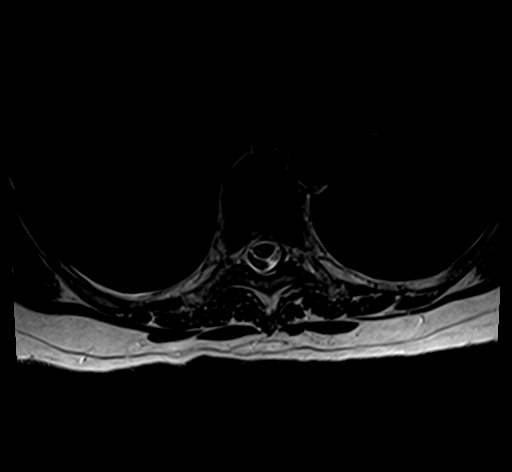
[im 36/48]
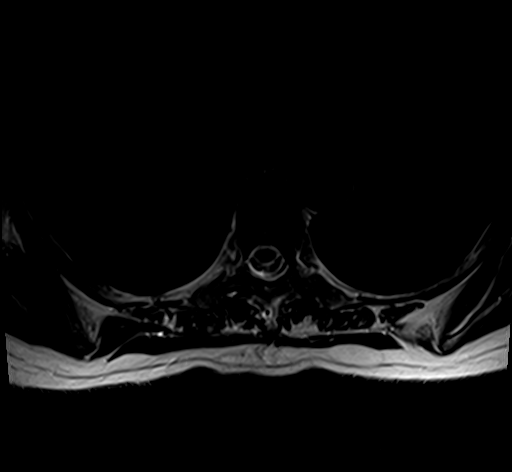
[im 42/48]
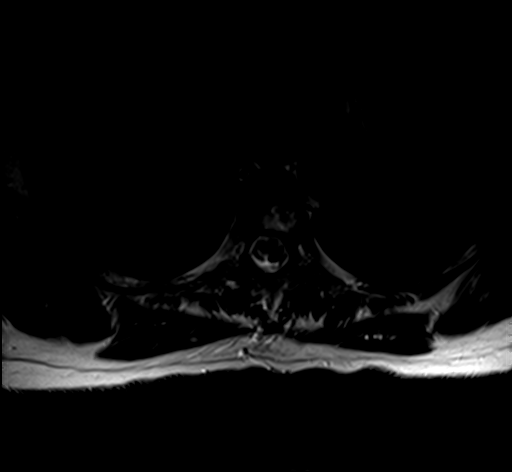

[Series 11: T1 · axial · 4.0mm · 0.39mm/px · z∈[-292,-77]mm · 3 of 48 slices shown (2 of 2)]
[im 6/48]
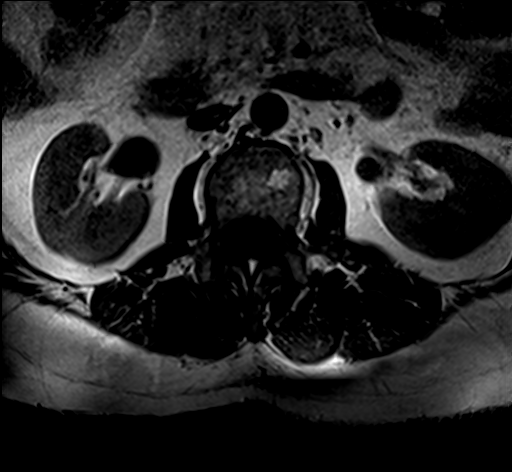
[im 24/48]
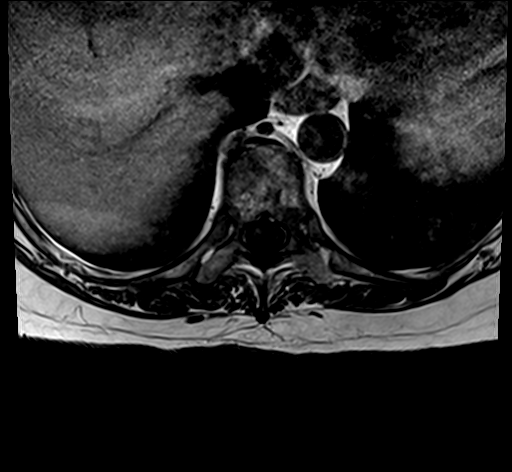
[im 42/48]
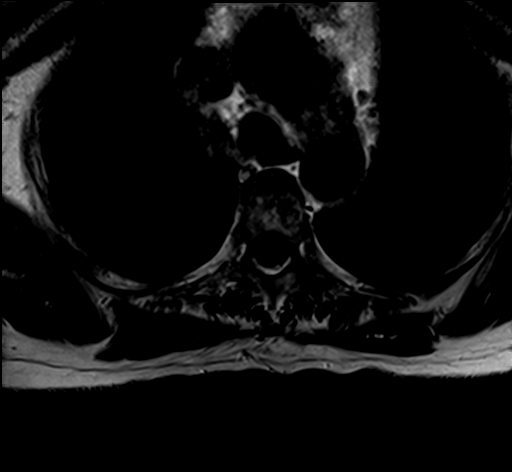

[17 of 48 positions shown; findings below may reference images not displayed]

FINDINGS: There is abnormal signal from the thoracic spinal cord at T7 to the
right at T9 into the right posterior centrally at T10-11 bilaterally
at T12 and to the left at T12-L1. No mass lesions. No abnormal
enhancement of these lesions after contrast administration.

There are multiple atypical hemangiomas in the thoracic spine
including T3 and T4 and T9 and T11 with smaller typical hemangiomata
in T6, T9 and L2. The appearance is not suggestive of metastatic
disease.

There small central disc protrusions at T4-5, T6-7 and T8-9 without
myelopathy. No spinal or foraminal stenosis.

Paraspinal soft tissues are normal. Multiple large gallstones are
noted with the largest being 2.7 cm in diameter.
IMPRESSION: 1. Multiple areas of abnormal signal in the thoracic spinal cord
consistent with demyelination similar to the appearance in the
cervical spinal cord. The findings are consistent with demyelinating
disease.
2. Multiple typical and atypical hemangiomata in the thoracic spine.
3. Cholelithiasis.
